# Patient Record
Sex: Male | Born: 1980 | Race: Black or African American | Hispanic: No | Marital: Married | Smoking: Never smoker
Health system: Southern US, Community
[De-identification: ages and names within clinical notes are randomized; demographics above are authoritative.]

---
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
## 2004-10-14 ENCOUNTER — Inpatient Hospital Stay (HOSPITAL_COMMUNITY): Admission: AC | Admit: 2004-10-14 | Discharge: 2004-10-17 | Payer: Self-pay

## 2004-10-25 ENCOUNTER — Emergency Department (HOSPITAL_COMMUNITY): Admission: EM | Admit: 2004-10-25 | Discharge: 2004-10-25 | Payer: Self-pay | Admitting: Family Medicine

## 2006-04-15 ENCOUNTER — Emergency Department (HOSPITAL_COMMUNITY): Admission: EM | Admit: 2006-04-15 | Discharge: 2006-04-15 | Payer: Self-pay | Admitting: Emergency Medicine

## 2009-01-02 ENCOUNTER — Emergency Department (HOSPITAL_COMMUNITY): Admission: EM | Admit: 2009-01-02 | Discharge: 2009-01-03 | Payer: Self-pay | Admitting: Emergency Medicine

## 2009-02-14 ENCOUNTER — Emergency Department (HOSPITAL_COMMUNITY): Admission: EM | Admit: 2009-02-14 | Discharge: 2009-02-14 | Payer: Self-pay | Admitting: Emergency Medicine

## 2010-12-05 LAB — CBC
MCHC: 33.2 g/dL (ref 30.0–36.0)
RDW: 12.4 % (ref 11.5–15.5)

## 2010-12-05 LAB — DIFFERENTIAL
Basophils Absolute: 0 10*3/uL (ref 0.0–0.1)
Basophils Relative: 1 % (ref 0–1)
Neutro Abs: 1.7 10*3/uL (ref 1.7–7.7)
Neutrophils Relative %: 52 % (ref 43–77)

## 2010-12-05 LAB — POCT I-STAT, CHEM 8
Calcium, Ion: 1.01 mmol/L — ABNORMAL LOW (ref 1.12–1.32)
Chloride: 98 mEq/L (ref 96–112)
HCT: 47 % (ref 39.0–52.0)
Potassium: 3.7 mEq/L (ref 3.5–5.1)

## 2010-12-06 LAB — GC/CHLAMYDIA PROBE AMP, GENITAL: GC Probe Amp, Genital: NEGATIVE

## 2015-07-29 ENCOUNTER — Ambulatory Visit: Payer: Self-pay

## 2015-07-29 ENCOUNTER — Other Ambulatory Visit: Payer: Self-pay | Admitting: Occupational Medicine

## 2015-07-29 DIAGNOSIS — Z0000 Encounter for general adult medical examination without abnormal findings: Secondary | ICD-10-CM

## 2016-04-09 ENCOUNTER — Encounter (HOSPITAL_COMMUNITY): Payer: Self-pay | Admitting: *Deleted

## 2016-04-09 ENCOUNTER — Emergency Department (HOSPITAL_COMMUNITY)
Admission: EM | Admit: 2016-04-09 | Discharge: 2016-04-09 | Disposition: A | Discharge status: Home / Self Care | Payer: Self-pay | Attending: Emergency Medicine | Admitting: Emergency Medicine

## 2016-04-09 DIAGNOSIS — K029 Dental caries, unspecified: Secondary | ICD-10-CM | POA: Insufficient documentation

## 2016-04-09 DIAGNOSIS — K0889 Other specified disorders of teeth and supporting structures: Secondary | ICD-10-CM

## 2016-04-09 MED ORDER — IBUPROFEN 800 MG PO TABS: 800.0000 mg | ORAL_TABLET | Freq: Three times a day (TID) | ORAL | 0 refills | Status: AC | PRN

## 2016-04-09 MED ORDER — IBUPROFEN 400 MG PO TABS
800.0000 mg | ORAL_TABLET | Freq: Once | ORAL | Status: AC
  Administered 2016-04-09: 800 mg via ORAL
  Filled 2016-04-09: qty 2

## 2016-04-09 MED ORDER — ACETAMINOPHEN 500 MG PO TABS
1000.0000 mg | ORAL_TABLET | Freq: Once | ORAL | Status: AC
  Administered 2016-04-09: 1000 mg via ORAL
  Filled 2016-04-09: qty 2

## 2016-04-09 MED ORDER — KETOROLAC TROMETHAMINE 30 MG/ML IJ SOLN
60.0000 mg | Freq: Once | INTRAMUSCULAR | Status: AC
  Administered 2016-04-09: 60 mg via INTRAMUSCULAR
  Filled 2016-04-09: qty 2

## 2016-04-09 MED ORDER — TRAMADOL HCL 50 MG PO TABS: 50.0000 mg | ORAL_TABLET | Freq: Four times a day (QID) | ORAL | 0 refills | Status: AC | PRN

## 2016-04-09 NOTE — ED Triage Notes (Signed)
Unable to give complete dose of toradol because PT pulled the syringe needle off his ARM.

## 2016-04-09 NOTE — Discharge Instructions (Signed)
Return here as needed.  Follow-up with your dentist on the one provided a follow-up with one provided.  He will need to call them tomorrow for an appointment

## 2016-04-09 NOTE — ED Provider Notes (Signed)
[facility]-EMERGENCY DEPT Provider Note   CSN: [id number] Arrival date & time: 04/09/16  0441  First Provider Contact:  None       History   Chief Complaint Chief Complaint  Patient presents with  . Dental Pain    HPI Timothy Knight is a 35 y.o. male.  HPI Patient presents to the emergency department with dental pain that started over the last few days.  The patient states that he has had problems with this tooth in the past and states that he usually just deals with it, but today the pain was worse.  The patient states he has seen a dentist about this issue.  Patient states he did not take any medications prior to arrival.  He did attempt to use some filler to cover the whole limits and the tooth.  Patient denies shortness of breath, neck swelling, tongue swelling, difficulty swallowing, fever, nausea, vomiting, or syncope History reviewed. No pertinent past medical history.  There are no active problems to display for this patient.   History reviewed. No pertinent surgical history.     Home Medications    Prior to Admission medications   Not on File    Family History No family history on file.  Social History Social History  Substance Use Topics  . Smoking status: Never Smoker  . Smokeless tobacco: Never Used  . Alcohol use No     Allergies   Review of patient's allergies indicates not on file.   Review of Systems Review of Systems  All other systems negative except as documented in the HPI. All pertinent positives and negatives as reviewed in the HPI. Physical Exam Updated Vital Signs BP 129/80 (BP Location: Right Arm)   Pulse (!) 58   Temp 98.4 F (36.9 C) (Oral)   Resp 16   Ht  (1.854 m)   Wt 73.2 kg   SpO2 99%   BMI 21.29 kg/m   Physical Exam  Constitutional: He is oriented to person, place, and time. He appears well-developed and well-nourished.  HENT:  Head: Normocephalic and atraumatic.  Mouth/Throat: Oropharynx is clear and moist.      Eyes: Pupils are equal, round, and reactive to light.  Neck: Normal range of motion. Neck supple.  Pulmonary/Chest: Effort normal.  Lymphadenopathy:       Head (right side): No submental, no submandibular and no tonsillar adenopathy present.       Head (left side): No submental, no submandibular and no tonsillar adenopathy present.       Right cervical: No superficial cervical adenopathy present.      Left cervical: No superficial cervical adenopathy present.  Neurological: He is alert and oriented to person, place, and time.  Skin: Skin is warm and dry.     ED Treatments / Results  Labs (all labs ordered are listed, but only abnormal results are displayed) Labs Reviewed - No data to display  EKG  EKG Interpretation None       Radiology No results found.  Procedures Procedures (including critical care time)  Medications Ordered in ED Medications  ketorolac (TORADOL) 30 MG/ML injection 60 mg (not administered)     Initial Impression / Assessment and Plan / ED Course  I have reviewed the triage vital signs and the nursing notes.  Pertinent labs & imaging results that were available during my care of the patient were reviewed by me and considered in my medical decision making (see chart for details).  Clinical Course  Patient will be referred to dentistry and told to return here as needed.  Patient agrees the plan and all questions were answered  Final Clinical Impressions(s) / ED Diagnoses   Final diagnoses:  None    New Prescriptions New Prescriptions   No medications on file     Charlestine NightChristopher Shizue Kaseman, PA-C 04/09/16 0735    Lorre NickAnthony Allen, MD 04/10/16 [id number]

## 2016-04-09 NOTE — ED Notes (Signed)
Declined W/C at D/C and was escorted to lobby by RN. 

## 2016-04-09 NOTE — ED Triage Notes (Signed)
The pt has had a toothache for 2months  Worse for 2 days

## 2016-05-13 IMAGING — CR DG CHEST 1V
1 series · 1 of 1 positions shown · non-contrast
Comparison: None.

CLINICAL DATA: Physical exam for pre-employment

EXAM:
CHEST 1 VIEW

[view not recorded]
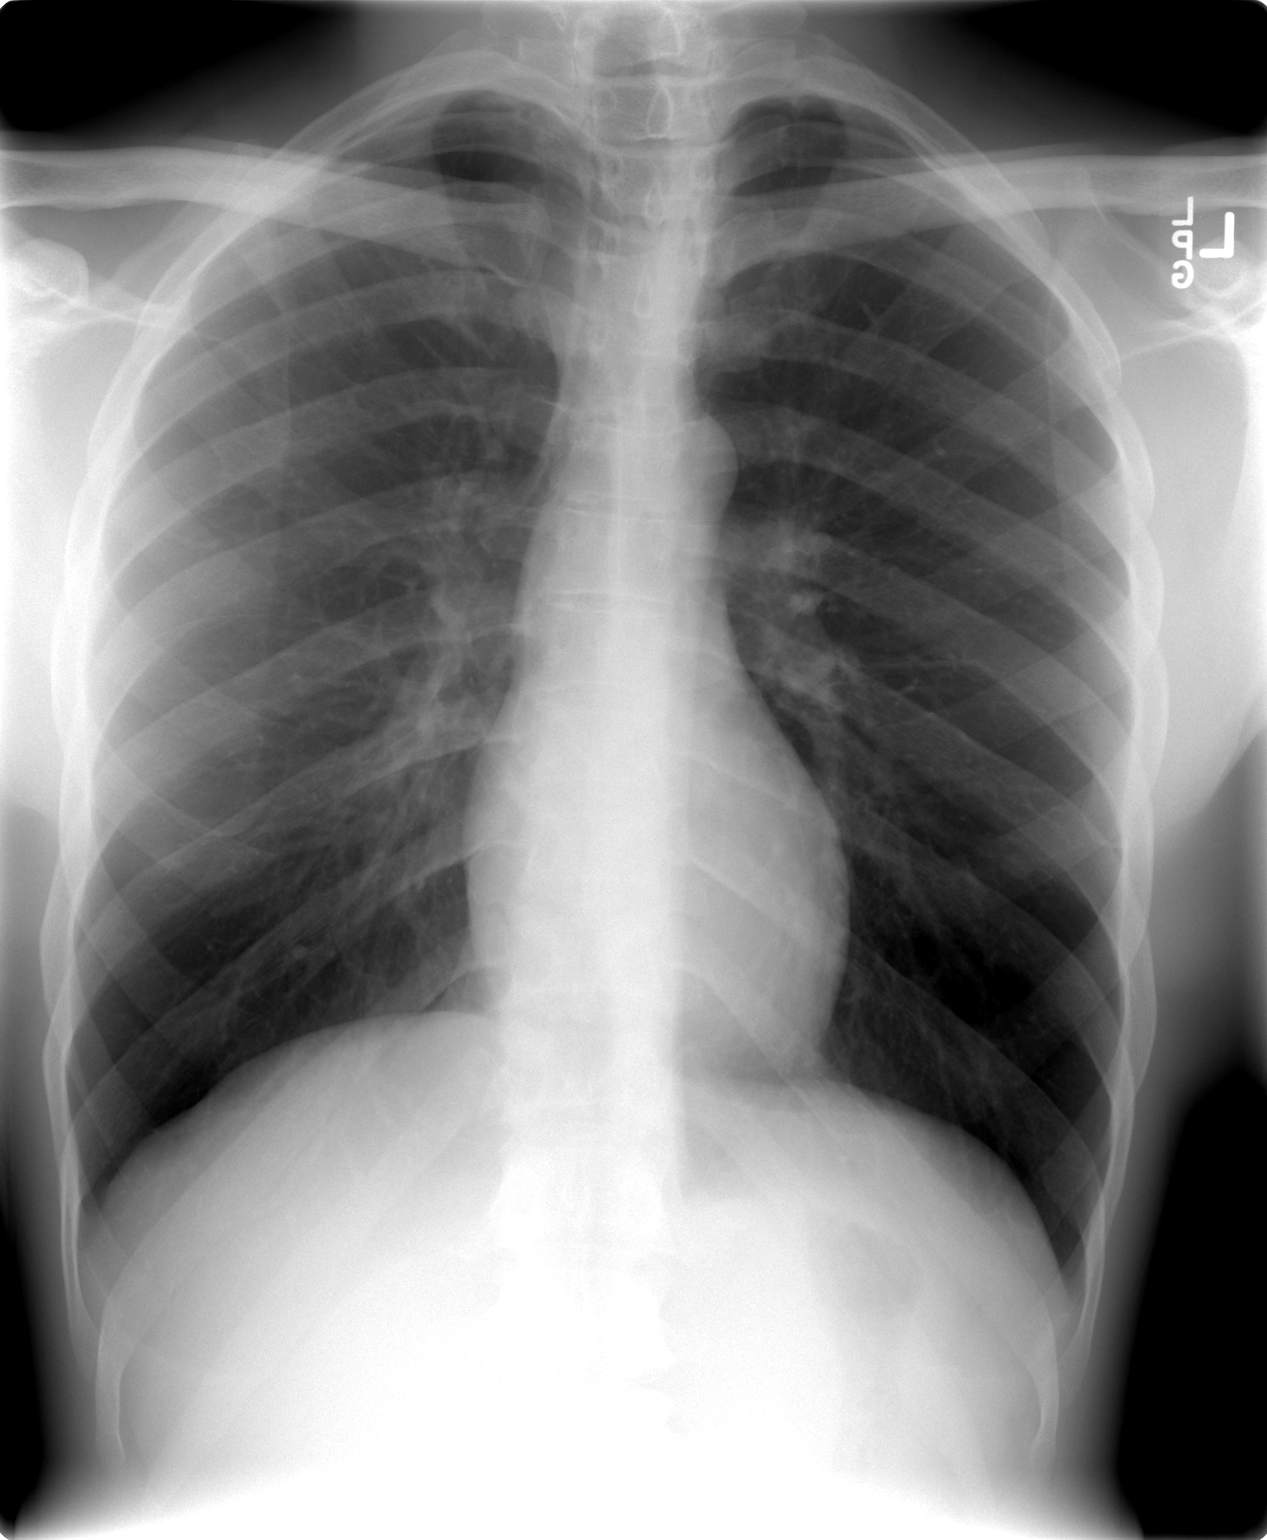

[1 of 1 positions shown; findings below may reference images not displayed]

FINDINGS: No active infiltrate or effusion is seen. Mediastinal and hilar
contours are unremarkable. The heart is within normal limits in
size. No bony abnormality is seen.
IMPRESSION: No active disease.

## 2017-07-03 ENCOUNTER — Other Ambulatory Visit: Payer: Self-pay | Admitting: Occupational Medicine

## 2017-07-03 ENCOUNTER — Ambulatory Visit: Payer: Self-pay

## 2017-07-03 DIAGNOSIS — Z0000 Encounter for general adult medical examination without abnormal findings: Secondary | ICD-10-CM
